# Patient Record
Sex: Female | Born: 1955 | Race: Black or African American | Hispanic: No | Marital: Married | Smoking: Current every day smoker
Health system: Southern US, Community
[De-identification: ages and names within clinical notes are randomized; demographics above are authoritative.]

## PROBLEM LIST (undated history)

## (undated) DIAGNOSIS — I10 Essential (primary) hypertension: Secondary | ICD-10-CM

---

## 1992-12-31 HISTORY — PX: BREAST EXCISIONAL BIOPSY: SUR124

## 1992-12-31 HISTORY — PX: BREAST BIOPSY: SHX20

## 2005-07-10 ENCOUNTER — Ambulatory Visit: Payer: Self-pay | Admitting: Unknown Physician Specialty

## 2005-12-31 HISTORY — PX: BREAST BIOPSY: SHX20

## 2006-08-14 ENCOUNTER — Ambulatory Visit: Payer: Self-pay | Admitting: Unknown Physician Specialty

## 2007-08-21 ENCOUNTER — Ambulatory Visit: Payer: Self-pay | Admitting: Unknown Physician Specialty

## 2007-10-30 ENCOUNTER — Emergency Department: Payer: Self-pay

## 2007-10-31 ENCOUNTER — Ambulatory Visit: Payer: Self-pay | Admitting: Unknown Physician Specialty

## 2008-09-14 ENCOUNTER — Ambulatory Visit: Payer: Self-pay | Admitting: Unknown Physician Specialty

## 2009-11-17 ENCOUNTER — Ambulatory Visit: Payer: Self-pay | Admitting: Unknown Physician Specialty

## 2010-03-20 ENCOUNTER — Ambulatory Visit: Payer: Self-pay

## 2010-03-28 ENCOUNTER — Ambulatory Visit: Payer: Self-pay

## 2010-12-05 ENCOUNTER — Ambulatory Visit: Payer: Self-pay | Admitting: Unknown Physician Specialty

## 2012-01-08 ENCOUNTER — Ambulatory Visit: Payer: Self-pay | Admitting: Unknown Physician Specialty

## 2012-05-05 ENCOUNTER — Ambulatory Visit: Payer: Self-pay | Admitting: Internal Medicine

## 2012-05-19 ENCOUNTER — Ambulatory Visit: Payer: Self-pay | Admitting: Unknown Physician Specialty

## 2013-01-07 ENCOUNTER — Ambulatory Visit: Payer: Self-pay | Admitting: Unknown Physician Specialty

## 2014-01-11 ENCOUNTER — Ambulatory Visit: Payer: Self-pay | Admitting: Internal Medicine

## 2015-03-07 ENCOUNTER — Ambulatory Visit: Payer: Self-pay | Admitting: Physician Assistant

## 2015-05-17 ENCOUNTER — Emergency Department: Payer: Managed Care, Other (non HMO)

## 2015-05-17 ENCOUNTER — Other Ambulatory Visit: Payer: Self-pay

## 2015-05-17 ENCOUNTER — Emergency Department
Admission: EM | Admit: 2015-05-17 | Discharge: 2015-05-17 | Disposition: A | Discharge status: Home / Self Care | Payer: Managed Care, Other (non HMO) | Attending: Emergency Medicine | Admitting: Emergency Medicine

## 2015-05-17 DIAGNOSIS — R110 Nausea: Secondary | ICD-10-CM | POA: Insufficient documentation

## 2015-05-17 DIAGNOSIS — Z79899 Other long term (current) drug therapy: Secondary | ICD-10-CM | POA: Insufficient documentation

## 2015-05-17 DIAGNOSIS — R0789 Other chest pain: Secondary | ICD-10-CM | POA: Insufficient documentation

## 2015-05-17 DIAGNOSIS — Z720 Tobacco use: Secondary | ICD-10-CM | POA: Insufficient documentation

## 2015-05-17 DIAGNOSIS — I10 Essential (primary) hypertension: Secondary | ICD-10-CM | POA: Insufficient documentation

## 2015-05-17 HISTORY — DX: Essential (primary) hypertension: I10

## 2015-05-17 LAB — COMPREHENSIVE METABOLIC PANEL
ALT: 17 U/L (ref 14–54)
AST: 22 U/L (ref 15–41)
Albumin: 3.8 g/dL (ref 3.5–5.0)
Alkaline Phosphatase: 95 U/L (ref 38–126)
Anion gap: 7 (ref 5–15)
BUN: 18 mg/dL (ref 6–20)
CHLORIDE: 107 mmol/L (ref 101–111)
CO2: 27 mmol/L (ref 22–32)
CREATININE: 0.81 mg/dL (ref 0.44–1.00)
Calcium: 9.6 mg/dL (ref 8.9–10.3)
GFR calc non Af Amer: >60 mL/min (ref >60)
Glucose, Bld: 119 mg/dL — ABNORMAL HIGH (ref 65–99)
Potassium: 3.7 mmol/L (ref 3.5–5.1)
Sodium: 141 mmol/L (ref 135–145)
TOTAL PROTEIN: 6.8 g/dL (ref 6.5–8.1)
Total Bilirubin: 0.2 mg/dL — ABNORMAL LOW (ref 0.3–1.2)

## 2015-05-17 LAB — CBC
HEMATOCRIT: 40.2 % (ref 35.0–47.0)
Hemoglobin: 13.3 g/dL (ref 12.0–16.0)
MCH: 29.2 pg (ref 26.0–34.0)
MCHC: 33.2 g/dL (ref 32.0–36.0)
MCV: 87.9 fL (ref 80.0–100.0)
Platelets: 187 10*3/uL (ref 150–440)
RBC: 4.57 MIL/uL (ref 3.80–5.20)
RDW: 15 % — ABNORMAL HIGH (ref 11.5–14.5)
WBC: 8.8 10*3/uL (ref 3.6–11.0)

## 2015-05-17 LAB — TROPONIN I

## 2015-05-17 MED ORDER — KETOROLAC TROMETHAMINE 30 MG/ML IJ SOLN: 30.0000 mg | Freq: Once | INTRAMUSCULAR | Status: DC

## 2015-05-17 MED ORDER — IBUPROFEN 600 MG PO TABS
600.0000 mg | ORAL_TABLET | Freq: Once | ORAL | Status: AC
  Administered 2015-05-17: 600 mg via ORAL

## 2015-05-17 MED ORDER — IBUPROFEN 600 MG PO TABS
ORAL_TABLET | ORAL | Status: AC
  Administered 2015-05-17: 600 mg via ORAL
  Filled 2015-05-17: qty 1

## 2015-05-17 NOTE — ED Notes (Signed)
Pt in with co pain to right chest since 0330, states worse when she takes a deep breath movement.  No injury or history of the same.

## 2015-05-17 NOTE — ED Notes (Signed)
Patient transported to X-ray 

## 2015-05-17 NOTE — ED Provider Notes (Signed)
[facility] Provider Note  ____________________________________________  Time seen: 7:25 AM  I have reviewed the triage vital signs and the nursing notes.   HISTORY  Chief Complaint Chest Pain  right-sided.    HPI [facility] is a 59 y.o. female who awoke at approximately 3:30 AM with pain in her right lower chest. The area is sore and tender. She denies shortness of breath and takes a deep breath on occasion to see if that will help. Taking the deep breath does not make it worse but does not make it better.. She does report some nausea which is now improved. The pain has been steady, consistent, since she awoke with it. She had no discomfort yesterday. The pain has been moderate and is clearly disturbing to the patient. It is been achy.  She felt fine yesterday without any symptoms. She has no known instigating event.     Past Medical History  Diagnosis Date  . Hypertension     There are no active problems to display for this patient.   History reviewed. No pertinent past surgical history.  Current Outpatient Rx  Name  Route  Sig  Dispense  Refill  . hydrochlorothiazide (MICROZIDE) 12.5 MG capsule   Oral   Take 12.5 mg by mouth daily.         . metoprolol tartrate (LOPRESSOR) 25 MG tablet   Oral   Take 12.5 mg by mouth 1 day or 1 dose.         . simvastatin (ZOCOR) 20 MG tablet   Oral   Take 20 mg by mouth daily.           Allergies Review of patient's allergies indicates no known allergies.  History reviewed. No pertinent family history.  Social History History  Substance Use Topics  . Smoking status: Current Every Day Smoker -- 0.50 packs/day for 20 years  . Smokeless tobacco: Never Used  . Alcohol Use: No    Review of Systems  Constitutional: Negative for fever. ENT: Negative for sore throat. Cardiovascular: Positive for chest pain on right. See history of present illness Respiratory:  Negative for shortness of breath. Gastrointestinal: Negative for abdominal pain, vomiting and diarrhea. Genitourinary: Negative for dysuria. Musculoskeletal: Negative for back pain. Skin: Negative for rash. Neurological: Negative for headaches   10-point ROS otherwise negative.  ____________________________________________   PHYSICAL EXAM:  VITAL SIGNS: ED Triage Vitals  Enc Vitals Group     BP 05/17/15 0604 132/95 mmHg     Pulse Rate 05/17/15 0604 78     Resp 05/17/15 0604 18     Temp 05/17/15 0604 97.4 F (36.3 C)     Temp Source 05/17/15 0604 Oral     SpO2 05/17/15 0604 100 %     Weight 05/17/15 0604 158 lb (71.668 kg)     Height 05/17/15 0604 5\' 5"  (1.651 m)     Head Cir --      Peak Flow --      Pain Score 05/17/15 0604 7     Pain Loc --      Pain Edu? --      Excl. in GC? --     Constitutional: Alert and oriented. She appears overall comfortable but holds her right hand over her right lower ribs as though she is guarding them.  ENT   Head: Normocephalic and atraumatic.   Nose: No congestion/rhinnorhea.   Mouth/Throat: Mucous membranes are moist. Cardiovascular: Normal rate, regular rhythm. Respiratory:  Normal respiratory effort without tachypnea. Breath sounds are clear and equal bilaterally. No wheezes/rales/rhonchi. She is tender in the right lower ribs. No chest pain over the sternum or her left ribs or in the upper right ribs. Gastrointestinal: Soft and nontender. No distention.  Back: There is no CVA tenderness. Musculoskeletal: Nontender with normal range of motion in all extremities.  No noted edema. Neurologic:  Normal speech and language. No gross focal neurologic deficits are appreciated.  Skin:  Skin is warm, dry. No rash noted. Psychiatric: Mood and affect are normal. Speech and behavior are normal.  ____________________________________________    LABS (pertinent positives/negatives)  CBC within normal limits metabolic panel within  normal limits except for slight elevation of glucose at 119. Troponin is not detected.  ____________________________________________   EKG  At 6:10 AM, EKG shows normal sinus rhythm at a rate of 72 with normal intervals normal access and no ST changes.  Second EKG done at 825 shows sinus bradycardia at a rate of 58 with normal axis and normal intervals.  ____________________________________________    RADIOLOGY  No acute abnormalities noticed on chest x-ray.  ____________________________________________   PROCEDURES  Procedure(s) performed: None  Critical Care performed: No  ____________________________________________   INITIAL IMPRESSION / ASSESSMENT AND PLAN / ED COURSE  Likely musculoskeletal chest pain in the right lower rib cage.  ----------------------------------------- 8:54 AM on 05/17/2015 -----------------------------------------  Reexamination, patient feels more comfortable than she did previously without any medications or treatment. We will treat her with ibuprofen now for likely musculoskeletal chest pain. Her labs and a chest x-ray and EKG were reviewed as noted above. ____________________________________________   FINAL CLINICAL IMPRESSION(S) / ED DIAGNOSES  Final diagnoses:  Right-sided chest wall pain      Darien Ramusavid W Aleph Nickson, MD 05/17/15 [phone]

## 2015-05-17 NOTE — Discharge Instructions (Signed)

## 2016-03-06 ENCOUNTER — Other Ambulatory Visit: Payer: Self-pay | Admitting: Physician Assistant

## 2016-03-06 DIAGNOSIS — Z1231 Encounter for screening mammogram for malignant neoplasm of breast: Secondary | ICD-10-CM

## 2016-03-13 ENCOUNTER — Ambulatory Visit: Payer: Managed Care, Other (non HMO)

## 2016-03-15 ENCOUNTER — Ambulatory Visit
Admission: RE | Admit: 2016-03-15 | Discharge: 2016-03-15 | Disposition: A | Discharge status: Home / Self Care | Payer: Managed Care, Other (non HMO) | Source: Ambulatory Visit | Attending: Physician Assistant | Admitting: Physician Assistant

## 2016-03-15 DIAGNOSIS — Z1231 Encounter for screening mammogram for malignant neoplasm of breast: Secondary | ICD-10-CM | POA: Diagnosis present

## 2017-02-11 ENCOUNTER — Other Ambulatory Visit: Payer: Self-pay | Admitting: Physician Assistant

## 2017-02-11 DIAGNOSIS — Z1231 Encounter for screening mammogram for malignant neoplasm of breast: Secondary | ICD-10-CM

## 2017-03-19 ENCOUNTER — Ambulatory Visit
Admission: RE | Admit: 2017-03-19 | Discharge: 2017-03-19 | Disposition: A | Discharge status: Home / Self Care | Payer: Managed Care, Other (non HMO) | Source: Ambulatory Visit | Attending: Physician Assistant | Admitting: Physician Assistant

## 2017-03-19 DIAGNOSIS — Z1231 Encounter for screening mammogram for malignant neoplasm of breast: Secondary | ICD-10-CM | POA: Diagnosis present

## 2018-02-18 ENCOUNTER — Other Ambulatory Visit: Payer: Self-pay | Admitting: Physician Assistant

## 2018-02-18 DIAGNOSIS — Z1231 Encounter for screening mammogram for malignant neoplasm of breast: Secondary | ICD-10-CM

## 2018-03-31 ENCOUNTER — Ambulatory Visit
Admission: RE | Admit: 2018-03-31 | Discharge: 2018-03-31 | Disposition: A | Discharge status: Home / Self Care | Payer: 59 | Source: Ambulatory Visit | Attending: Physician Assistant | Admitting: Physician Assistant

## 2018-03-31 DIAGNOSIS — Z1231 Encounter for screening mammogram for malignant neoplasm of breast: Secondary | ICD-10-CM | POA: Insufficient documentation

## 2019-02-23 ENCOUNTER — Other Ambulatory Visit: Payer: Self-pay | Admitting: Physician Assistant

## 2019-02-23 DIAGNOSIS — Z1231 Encounter for screening mammogram for malignant neoplasm of breast: Secondary | ICD-10-CM

## 2019-06-18 ENCOUNTER — Other Ambulatory Visit: Payer: Self-pay | Admitting: Physician Assistant

## 2019-06-18 DIAGNOSIS — Z1231 Encounter for screening mammogram for malignant neoplasm of breast: Secondary | ICD-10-CM

## 2019-07-31 ENCOUNTER — Ambulatory Visit
Admission: RE | Admit: 2019-07-31 | Discharge: 2019-07-31 | Disposition: A | Discharge status: Home / Self Care | Payer: BLUE CROSS/BLUE SHIELD | Source: Ambulatory Visit | Attending: Physician Assistant | Admitting: Physician Assistant

## 2019-07-31 DIAGNOSIS — Z1231 Encounter for screening mammogram for malignant neoplasm of breast: Secondary | ICD-10-CM | POA: Diagnosis not present

## 2020-07-26 ENCOUNTER — Other Ambulatory Visit: Payer: Self-pay | Admitting: Physician Assistant

## 2020-07-26 DIAGNOSIS — Z1231 Encounter for screening mammogram for malignant neoplasm of breast: Secondary | ICD-10-CM

## 2020-08-11 ENCOUNTER — Other Ambulatory Visit: Payer: Self-pay

## 2020-08-11 ENCOUNTER — Ambulatory Visit
Admission: RE | Admit: 2020-08-11 | Discharge: 2020-08-11 | Disposition: A | Discharge status: Home / Self Care | Payer: 59 | Source: Ambulatory Visit | Attending: Physician Assistant | Admitting: Physician Assistant

## 2020-08-11 DIAGNOSIS — Z1231 Encounter for screening mammogram for malignant neoplasm of breast: Secondary | ICD-10-CM | POA: Insufficient documentation

## 2021-04-26 IMAGING — MG DIGITAL SCREENING BILAT W/ TOMO W/ CAD
8 series · 8 of 24 positions shown · non-contrast
Comparison: Previous exam(s).

CLINICAL DATA: Screening.

EXAM:
DIGITAL SCREENING BILATERAL MAMMOGRAM WITH TOMO AND CAD

[R MLO synth-2D]
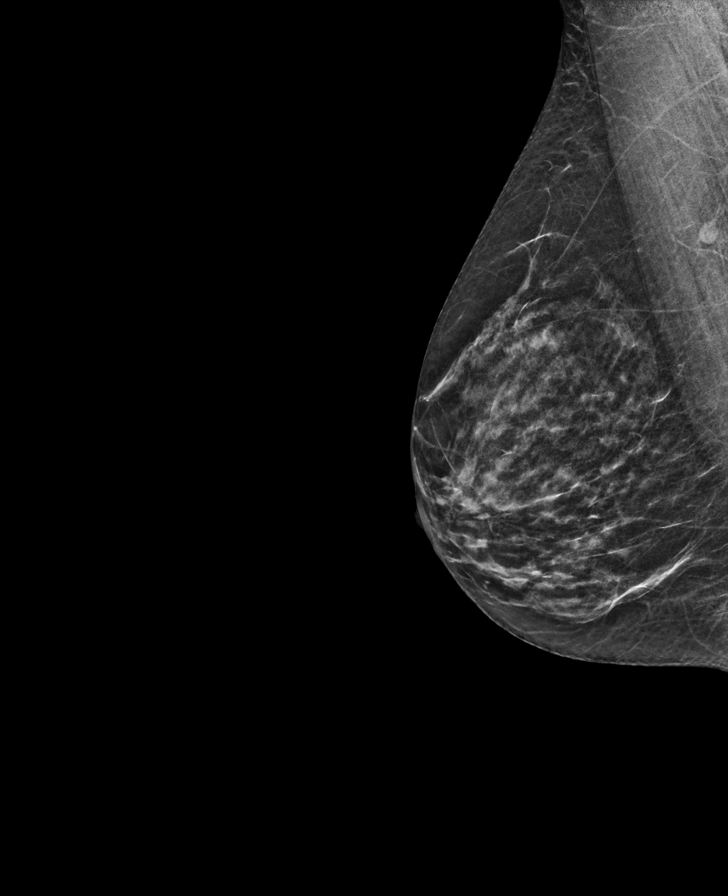

[L MLO synth-2D]
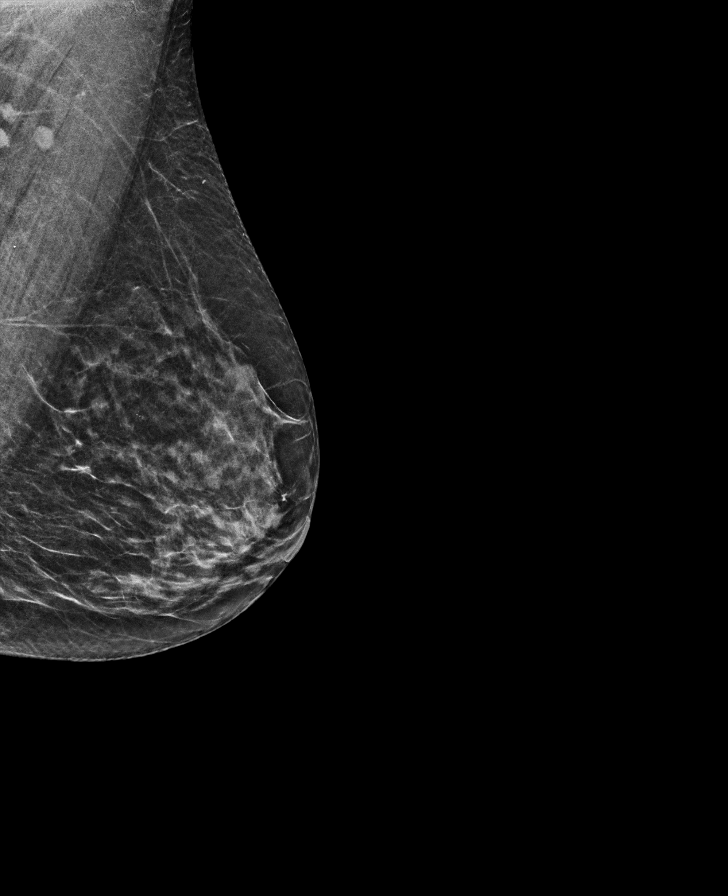

[R CC synth-2D]
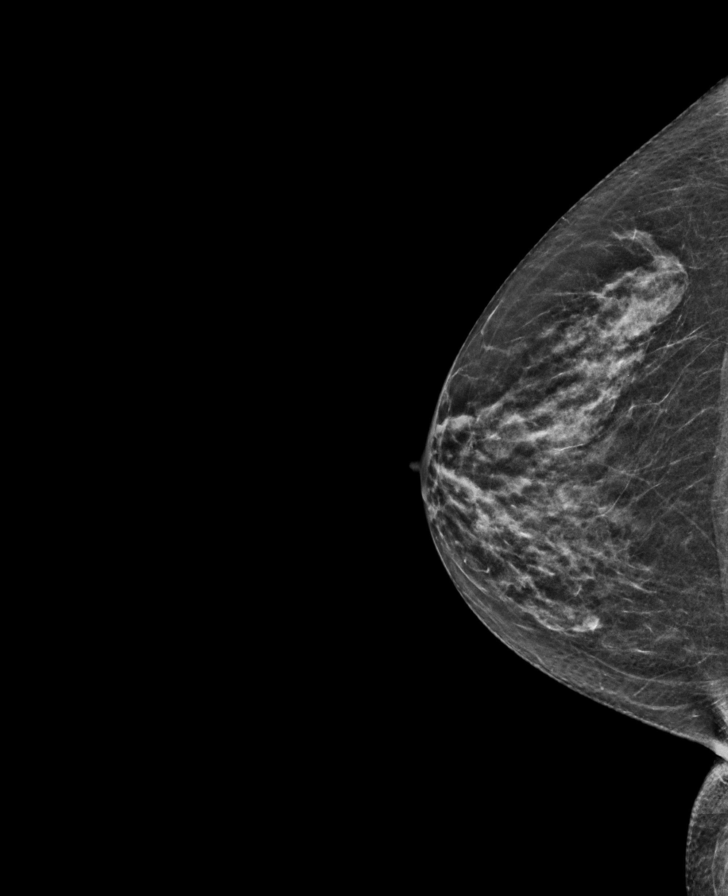

[L CC synth-2D]
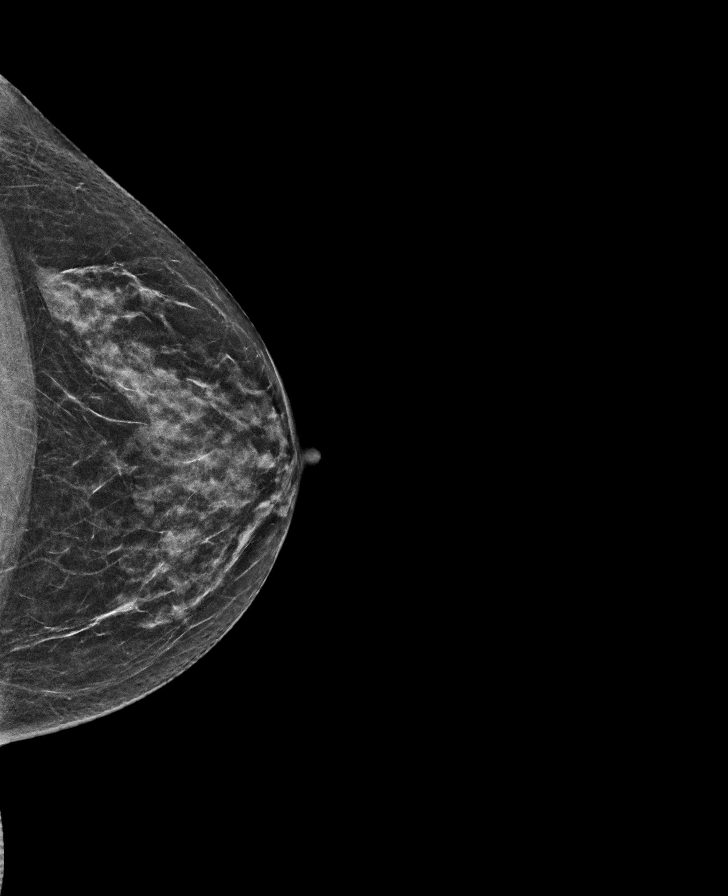

[L MLO tomo · tomo slice 24/47.0]
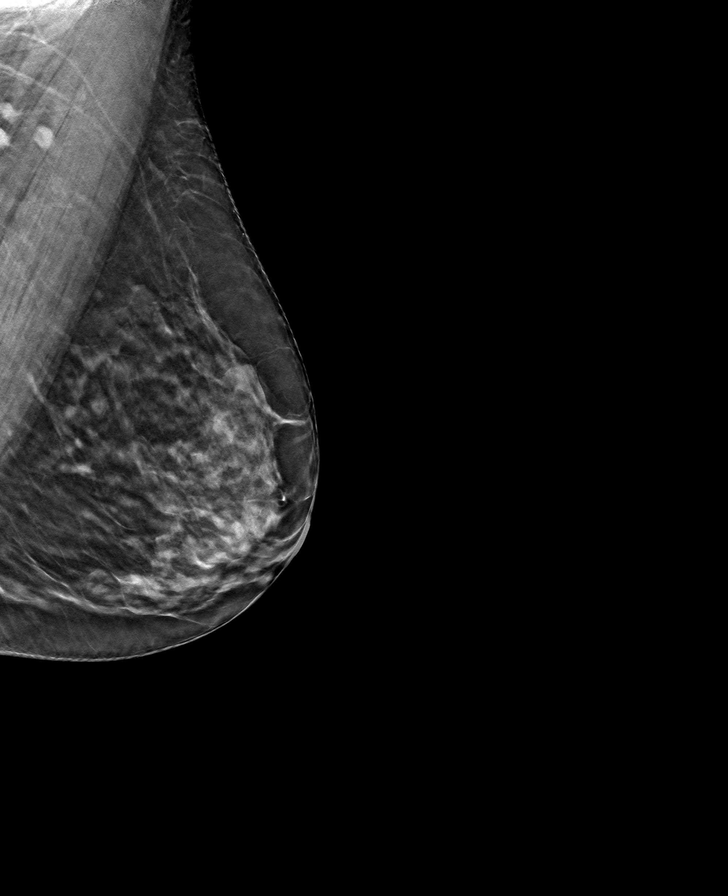

[R MLO tomo · tomo slice 23/45.0]
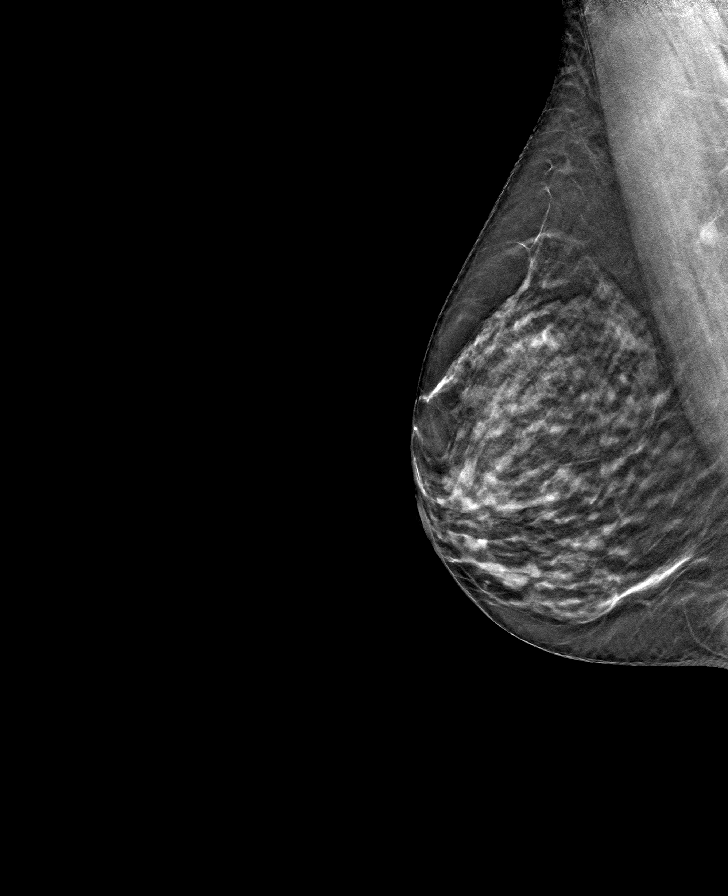

[L CC tomo · tomo slice 25/48.0]
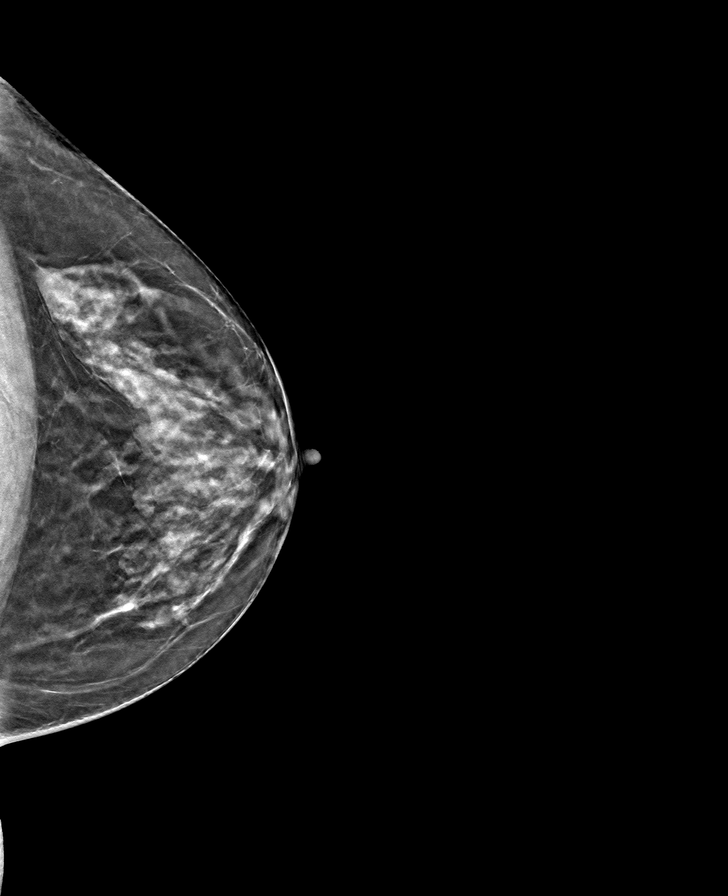

[R CC tomo · tomo slice 23/45.0]
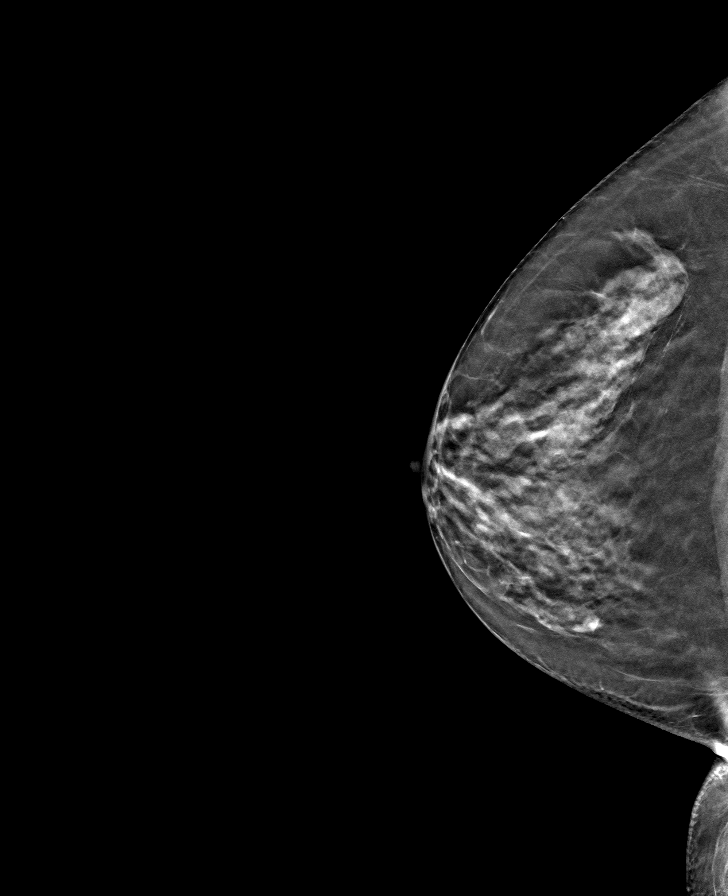

[8 of 24 positions shown; findings below may reference images not displayed]

ACR Breast Density Category c: The breast tissue is heterogeneously
dense, which may obscure small masses.
FINDINGS: There are no findings suspicious for malignancy. Images were
processed with CAD.
IMPRESSION: No mammographic evidence of malignancy. A result letter of this
screening mammogram will be mailed directly to the patient.

RECOMMENDATION:
Screening mammogram in one year. (Code:[id number]-U-LHB)

BI-RADS CATEGORY  1: Negative.

## 2021-07-13 ENCOUNTER — Other Ambulatory Visit: Payer: Self-pay | Admitting: Physician Assistant

## 2021-07-13 DIAGNOSIS — Z1231 Encounter for screening mammogram for malignant neoplasm of breast: Secondary | ICD-10-CM

## 2021-08-15 ENCOUNTER — Ambulatory Visit
Admission: RE | Admit: 2021-08-15 | Discharge: 2021-08-15 | Disposition: A | Discharge status: Home / Self Care | Payer: 59 | Source: Ambulatory Visit | Attending: Physician Assistant | Admitting: Physician Assistant

## 2021-08-15 ENCOUNTER — Other Ambulatory Visit: Payer: Self-pay

## 2021-08-15 DIAGNOSIS — Z1231 Encounter for screening mammogram for malignant neoplasm of breast: Secondary | ICD-10-CM | POA: Insufficient documentation

## 2022-05-01 ENCOUNTER — Other Ambulatory Visit: Payer: Self-pay | Admitting: Physician Assistant

## 2022-05-01 DIAGNOSIS — Z1231 Encounter for screening mammogram for malignant neoplasm of breast: Secondary | ICD-10-CM

## 2022-08-20 ENCOUNTER — Ambulatory Visit
Admission: RE | Admit: 2022-08-20 | Discharge: 2022-08-20 | Disposition: A | Discharge status: Home / Self Care | Payer: Medicare PPO | Source: Ambulatory Visit | Attending: Physician Assistant | Admitting: Physician Assistant

## 2022-08-20 DIAGNOSIS — Z1231 Encounter for screening mammogram for malignant neoplasm of breast: Secondary | ICD-10-CM | POA: Insufficient documentation

## 2023-03-05 ENCOUNTER — Ambulatory Visit: Payer: Medicare HMO

## 2023-03-05 DIAGNOSIS — K640 First degree hemorrhoids: Secondary | ICD-10-CM | POA: Diagnosis not present

## 2023-03-05 DIAGNOSIS — K635 Polyp of colon: Secondary | ICD-10-CM | POA: Diagnosis not present

## 2023-03-05 DIAGNOSIS — Z86010 Personal history of colonic polyps: Secondary | ICD-10-CM | POA: Diagnosis present

## 2023-03-05 DIAGNOSIS — K5730 Diverticulosis of large intestine without perforation or abscess without bleeding: Secondary | ICD-10-CM | POA: Diagnosis not present

## 2024-02-25 ENCOUNTER — Other Ambulatory Visit: Payer: Self-pay | Admitting: Physician Assistant

## 2024-02-25 DIAGNOSIS — Z1231 Encounter for screening mammogram for malignant neoplasm of breast: Secondary | ICD-10-CM

## 2024-03-04 ENCOUNTER — Ambulatory Visit
Admission: RE | Admit: 2024-03-04 | Discharge: 2024-03-04 | Disposition: A | Discharge status: Home / Self Care | Payer: Medicare PPO | Source: Ambulatory Visit | Attending: Physician Assistant | Admitting: Physician Assistant

## 2024-03-04 DIAGNOSIS — Z1231 Encounter for screening mammogram for malignant neoplasm of breast: Secondary | ICD-10-CM | POA: Insufficient documentation

## 2024-03-12 ENCOUNTER — Other Ambulatory Visit: Payer: Self-pay | Admitting: Physician Assistant

## 2024-03-12 DIAGNOSIS — R928 Other abnormal and inconclusive findings on diagnostic imaging of breast: Secondary | ICD-10-CM

## 2024-03-18 ENCOUNTER — Ambulatory Visit
Admission: RE | Admit: 2024-03-18 | Discharge: 2024-03-18 | Disposition: A | Discharge status: Home / Self Care | Source: Ambulatory Visit | Attending: Physician Assistant | Admitting: Physician Assistant

## 2024-03-18 DIAGNOSIS — R928 Other abnormal and inconclusive findings on diagnostic imaging of breast: Secondary | ICD-10-CM
